# Patient Record
Sex: Male | Born: 2000 | Race: White | Hispanic: No | Marital: Single
Health system: Southern US, Community
[De-identification: ages and names within clinical notes are randomized; demographics above are authoritative.]

## PROBLEM LIST (undated history)

## (undated) HISTORY — PX: TONSILLECTOMY: SUR1361

## (undated) HISTORY — PX: DENTAL SURGERY: SHX609

## (undated) HISTORY — PX: ADENOIDECTOMY: SUR15

---
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
## 2021-09-08 ENCOUNTER — Ambulatory Visit: Payer: Managed Care, Other (non HMO)

## 2021-09-08 ENCOUNTER — Other Ambulatory Visit: Payer: Self-pay | Admitting: Physician Assistant

## 2021-09-08 DIAGNOSIS — R0781 Pleurodynia: Secondary | ICD-10-CM

## 2021-09-08 DIAGNOSIS — Z83438 Family history of other disorder of lipoprotein metabolism and other lipidemia: Secondary | ICD-10-CM

## 2021-09-08 DIAGNOSIS — F419 Anxiety disorder, unspecified: Secondary | ICD-10-CM
